# Patient Record
Sex: Male | Born: 2000
Health system: Southern US, Community
[De-identification: ages and names within clinical notes are randomized; demographics above are authoritative.]

## PROBLEM LIST (undated history)

## (undated) DIAGNOSIS — F840 Autistic disorder: Secondary | ICD-10-CM

## (undated) DIAGNOSIS — F909 Attention-deficit hyperactivity disorder, unspecified type: Secondary | ICD-10-CM

## (undated) DIAGNOSIS — J383 Other diseases of vocal cords: Secondary | ICD-10-CM

## (undated) DIAGNOSIS — N200 Calculus of kidney: Secondary | ICD-10-CM

## (undated) DIAGNOSIS — F419 Anxiety disorder, unspecified: Secondary | ICD-10-CM

## (undated) DIAGNOSIS — Q676 Pectus excavatum: Secondary | ICD-10-CM

## (undated) HISTORY — DX: Attention-deficit hyperactivity disorder, unspecified type: F90.9

## (undated) HISTORY — DX: Autistic disorder: F84.0

## (undated) HISTORY — DX: Anxiety disorder, unspecified: F41.9

## (undated) HISTORY — DX: Calculus of kidney: N20.0

## (undated) HISTORY — DX: Pectus excavatum: Q67.6

## (undated) HISTORY — DX: Other diseases of vocal cords: J38.3

---
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
## 2018-02-26 DIAGNOSIS — Z0000 Encounter for general adult medical examination without abnormal findings: Secondary | ICD-10-CM | POA: Diagnosis not present

## 2018-02-26 DIAGNOSIS — Z23 Encounter for immunization: Secondary | ICD-10-CM | POA: Diagnosis not present

## 2020-07-18 DIAGNOSIS — F419 Anxiety disorder, unspecified: Secondary | ICD-10-CM | POA: Diagnosis not present

## 2020-07-18 DIAGNOSIS — Z79899 Other long term (current) drug therapy: Secondary | ICD-10-CM | POA: Diagnosis not present

## 2020-07-18 DIAGNOSIS — F321 Major depressive disorder, single episode, moderate: Secondary | ICD-10-CM | POA: Diagnosis not present

## 2020-07-18 DIAGNOSIS — F902 Attention-deficit hyperactivity disorder, combined type: Secondary | ICD-10-CM | POA: Diagnosis not present

## 2020-11-02 DIAGNOSIS — Z20822 Contact with and (suspected) exposure to covid-19: Secondary | ICD-10-CM | POA: Diagnosis not present

## 2020-11-02 DIAGNOSIS — U071 COVID-19: Secondary | ICD-10-CM | POA: Diagnosis not present

## 2020-11-03 ENCOUNTER — Ambulatory Visit (HOSPITAL_COMMUNITY)
Admission: EM | Admit: 2020-11-03 | Discharge: 2020-11-03 | Disposition: A | Discharge status: Home / Self Care | Payer: BC Managed Care – PPO

## 2020-11-03 ENCOUNTER — Encounter (HOSPITAL_COMMUNITY): Payer: Self-pay

## 2020-11-03 ENCOUNTER — Other Ambulatory Visit: Payer: Self-pay

## 2020-11-03 DIAGNOSIS — H66001 Acute suppurative otitis media without spontaneous rupture of ear drum, right ear: Secondary | ICD-10-CM | POA: Diagnosis not present

## 2020-11-03 DIAGNOSIS — U071 COVID-19: Secondary | ICD-10-CM | POA: Diagnosis not present

## 2020-11-03 DIAGNOSIS — R059 Cough, unspecified: Secondary | ICD-10-CM

## 2020-11-03 MED ORDER — BENZONATATE 100 MG PO CAPS: 100.0000 mg | ORAL_CAPSULE | Freq: Three times a day (TID) | ORAL | 0 refills | Status: DC

## 2020-11-03 MED ORDER — AMOXICILLIN-POT CLAVULANATE 875-125 MG PO TABS: 1.0000 | ORAL_TABLET | Freq: Two times a day (BID) | ORAL | 0 refills | Status: AC

## 2020-11-03 NOTE — Discharge Instructions (Addendum)
You have an ear infection so we have started Augmentin.  Given your young and healthy there is no indication to start oral antivirals for COVID-19.  This antibiotic will not treat your COVID and so you will need to continue using supportive care to manage the symptoms including Tessalon for cough, Mucinex, Flonase, drinking plenty of fluid.  If you have any worsening symptoms including difficulty breathing, high fever, chest pain, worsening cough you need to be seen immediately.  I do recommend that you follow-up with either our clinic or your primary care within a few weeks to ensure clearing of infection.  If you have any concerns please return for reevaluation.

## 2020-11-03 NOTE — ED Triage Notes (Signed)
Pt reports cough x 2 days; right ear pain since this morning. Pt reports + COVID test (home) since this morning.

## 2020-11-03 NOTE — ED Provider Notes (Signed)
[facility]    CSN: [id number] Arrival date & time: 11/03/20  1154      History   Chief Complaint Chief Complaint  Patient presents with   Cough    + COVID   Otalgia    HPI Jordan Cox is a 20 y.o. male.   Patient presents today with a 3 to 4-day history of URI symptoms.  He reports mild nasal congestion, cough, scratchy throat.  He has had some abdominal upset but denies any significant diarrhea, nausea, vomiting, abdominal pain.  He denies associated chest pain, shortness of breath, fever.  His primary concern today is right otalgia.  Reports pain is rated 7 on a 0-10 pain scale, localized to right ear, described as intense aching with periodic shooting pains, no alleviating factors identified.  He has tried ibuprofen and over-the-counter decongestants without improvement of symptoms.  He has been vaccinated for COVID-19 but because of URI symptoms took an at-home COVID test that was positive this morning.  He denies any significant past medical history including asthma, smoking, diabetes, heart disease, chronic liver/kidney disease, immunosuppression that warrant antivirals.  He denies any recent antibiotic use.   Past Medical History:  Diagnosis Date   ADHD (attention deficit hyperactivity disorder)    Pectus excavatum     There are no problems to display for this patient.   Past Surgical History:  Procedure Laterality Date   Nuss Surgery  09/2012   scope of bladder  01/2013       Home Medications    Prior to Admission medications   Medication Sig Start Date End Date Taking? Authorizing Provider  amoxicillin-clavulanate (AUGMENTIN) 875-125 MG tablet Take 1 tablet by mouth every 12 (twelve) hours. 11/03/20  Yes Baran Kuhrt K, PA-C  benzonatate (TESSALON) 100 MG capsule Take 1 capsule (100 mg total) by mouth every 8 (eight) hours. 11/03/20  Yes Janera Peugh K, PA-C  atomoxetine (STRATTERA) 80 MG capsule Take by mouth.    [provider]   ibuprofen (ADVIL,MOTRIN) 800 MG tablet Take 1 tablet (800 mg total) by mouth 3 (three) times daily. 03/22/17   Petrucelli, Samantha R, PA-C  sertraline (ZOLOFT) 25 MG tablet 25 mg.    [provider]    Family History Family History  Problem Relation Age of Onset   ADD / ADHD Sister     Social History Social History   Tobacco Use   Smoking status: Never   Smokeless tobacco: Never  Vaping Use   Vaping Use: Never used  Substance Use Topics   Alcohol use: Never   Drug use: Never     Allergies   Patient has no known allergies.   Review of Systems Review of Systems  Constitutional:  Positive for activity change, appetite change and fatigue. Negative for fever.  HENT:  Positive for congestion, ear pain and sore throat. Negative for sinus pressure and sneezing.   Respiratory:  Positive for cough. Negative for shortness of breath.   Cardiovascular:  Negative for chest pain.  Gastrointestinal:  Negative for abdominal pain, diarrhea, nausea and vomiting.  Musculoskeletal:  Negative for arthralgias and myalgias.  Neurological:  Negative for dizziness, light-headedness and headaches.    Physical Exam Triage Vital Signs ED Triage Vitals  Enc Vitals Group     BP 11/03/20 1254 137/77     Pulse Rate 11/03/20 1254 (!) 107     Resp 11/03/20 1254 18     Temp 11/03/20 1254 97.9 F (36.6 C)  Temp Source 11/03/20 1254 Oral     SpO2 11/03/20 1254 98 %     Weight --      Height --      Head Circumference --      Peak Flow --      Pain Score 11/03/20 1251 7     Pain Loc --      Pain Edu? --      Excl. in GC? --    No data found.  Updated Vital Signs BP 137/77 (BP Location: Left Arm)   Pulse (!) 107   Temp 97.9 F (36.6 C) (Oral)   Resp 18   SpO2 98%   Visual Acuity Right Eye Distance:   Left Eye Distance:   Bilateral Distance:    Right Eye Near:   Left Eye Near:    Bilateral Near:     Physical Exam Vitals reviewed.  Constitutional:      General: He  is awake.     Appearance: Normal appearance. He is normal weight. He is not ill-appearing.     Comments: Very pleasant male appears stated age in no acute distress sitting comfortably in exam room  HENT:     Head: Normocephalic and atraumatic.     Right Ear: Ear canal and external ear normal. Tympanic membrane is erythematous and bulging.     Left Ear: Tympanic membrane, ear canal and external ear normal. Tympanic membrane is not erythematous or bulging.     Nose: Nose normal.     Mouth/Throat:     Pharynx: Uvula midline. Posterior oropharyngeal erythema present. No oropharyngeal exudate.  Cardiovascular:     Rate and Rhythm: Normal rate and regular rhythm.     Heart sounds: Normal heart sounds, S1 normal and S2 normal. No murmur heard. Pulmonary:     Effort: Pulmonary effort is normal. No accessory muscle usage or respiratory distress.     Breath sounds: Normal breath sounds. No stridor. No wheezing, rhonchi or rales.     Comments: Clear to auscultation bilaterally Abdominal:     General: Bowel sounds are normal.     Palpations: Abdomen is soft.     Tenderness: There is no abdominal tenderness.  Lymphadenopathy:     Head:     Right side of head: No submental, submandibular or tonsillar adenopathy.     Left side of head: No submental, submandibular or tonsillar adenopathy.     Cervical: No cervical adenopathy.  Neurological:     Mental Status: He is alert.  Psychiatric:        Behavior: Behavior is cooperative.     UC Treatments / Results  Labs (all labs ordered are listed, but only abnormal results are displayed) Labs Reviewed - No data to display  EKG   Radiology No results found.  Procedures Procedures (including critical care time)  Medications Ordered in UC Medications - No data to display  Initial Impression / Assessment and Plan / UC Course  I have reviewed the triage vital signs and the nursing notes.  Pertinent labs & imaging results that were available  during my care of the patient were reviewed by me and considered in my medical decision making (see chart for details).      No negation for repeat COVID-19 testing given patient had positive at-home COVID test this morning this would not change management.  He is young and otherwise healthy so therefore would not be likely candidate for oral antivirals to treat COVID-19.  Otitis media was  identified on exam and he was started on Augmentin twice daily for 7 days.  Encouraged him to use over-the-counter medications to manage symptoms including Tylenol, ibuprofen, Mucinex, Flonase.  Recommended he rest and drink plenty of fluid.  He was given return to work note with current CDC return to work guidelines based on at home positive test.  Discussed alarm symptoms that warrant emergent evaluation.  Strict return precautions given to which patient expressed understanding.  Final Clinical Impressions(s) / UC Diagnoses   Final diagnoses:  COVID-19  Cough  Non-recurrent acute suppurative otitis media of right ear without spontaneous rupture of tympanic membrane     Discharge Instructions      You have an ear infection so we have started Augmentin.  Given your young and healthy there is no indication to start oral antivirals for COVID-19.  This antibiotic will not treat your COVID and so you will need to continue using supportive care to manage the symptoms including Tessalon for cough, Mucinex, Flonase, drinking plenty of fluid.  If you have any worsening symptoms including difficulty breathing, high fever, chest pain, worsening cough you need to be seen immediately.  I do recommend that you follow-up with either our clinic or your primary care within a few weeks to ensure clearing of infection.  If you have any concerns please return for reevaluation.     ED Prescriptions     Medication Sig Dispense Auth. Provider   amoxicillin-clavulanate (AUGMENTIN) 875-125 MG tablet Take 1 tablet by mouth every  12 (twelve) hours. 14 tablet Aviella Disbrow K, PA-C   benzonatate (TESSALON) 100 MG capsule Take 1 capsule (100 mg total) by mouth every 8 (eight) hours. 21 capsule Bruce Churilla K, PA-C      PDMP not reviewed this encounter.   Jeani Hawking, PA-C 11/03/20 1339

## 2021-01-07 ENCOUNTER — Emergency Department (HOSPITAL_BASED_OUTPATIENT_CLINIC_OR_DEPARTMENT_OTHER): Payer: BC Managed Care – PPO

## 2021-01-07 ENCOUNTER — Other Ambulatory Visit: Payer: Self-pay

## 2021-01-07 ENCOUNTER — Emergency Department (HOSPITAL_BASED_OUTPATIENT_CLINIC_OR_DEPARTMENT_OTHER)
Admission: EM | Admit: 2021-01-07 | Discharge: 2021-01-07 | Disposition: A | Discharge status: Home / Self Care | Payer: BC Managed Care – PPO | Attending: Student | Admitting: Student

## 2021-01-07 ENCOUNTER — Encounter (HOSPITAL_BASED_OUTPATIENT_CLINIC_OR_DEPARTMENT_OTHER): Payer: Self-pay | Admitting: Emergency Medicine

## 2021-01-07 DIAGNOSIS — R109 Unspecified abdominal pain: Secondary | ICD-10-CM | POA: Diagnosis not present

## 2021-01-07 DIAGNOSIS — R1084 Generalized abdominal pain: Secondary | ICD-10-CM | POA: Insufficient documentation

## 2021-01-07 DIAGNOSIS — R519 Headache, unspecified: Secondary | ICD-10-CM | POA: Diagnosis not present

## 2021-01-07 DIAGNOSIS — Z041 Encounter for examination and observation following transport accident: Secondary | ICD-10-CM | POA: Diagnosis not present

## 2021-01-07 DIAGNOSIS — R221 Localized swelling, mass and lump, neck: Secondary | ICD-10-CM | POA: Diagnosis not present

## 2021-01-07 DIAGNOSIS — Y92410 Unspecified street and highway as the place of occurrence of the external cause: Secondary | ICD-10-CM | POA: Diagnosis not present

## 2021-01-07 DIAGNOSIS — R1030 Lower abdominal pain, unspecified: Secondary | ICD-10-CM | POA: Diagnosis not present

## 2021-01-07 LAB — BASIC METABOLIC PANEL
Anion gap: 8 (ref 5–15)
BUN: 12 mg/dL (ref 6–20)
CO2: 28 mmol/L (ref 22–32)
Calcium: 9.4 mg/dL (ref 8.9–10.3)
Chloride: 101 mmol/L (ref 98–111)
Creatinine, Ser: 0.93 mg/dL (ref 0.61–1.24)
GFR, Estimated: >60 mL/min (ref >60)
Glucose, Bld: 80 mg/dL (ref 70–99)
Potassium: 4.1 mmol/L (ref 3.5–5.1)
Sodium: 137 mmol/L (ref 135–145)

## 2021-01-07 LAB — CBC WITH DIFFERENTIAL/PLATELET
Abs Immature Granulocytes: 0.08 10*3/uL — ABNORMAL HIGH (ref 0.00–0.07)
Basophils Absolute: 0 10*3/uL (ref 0.0–0.1)
Basophils Relative: 1 %
Eosinophils Absolute: 0.1 10*3/uL (ref 0.0–0.5)
Eosinophils Relative: 1 %
HCT: 45.9 % (ref 39.0–52.0)
Hemoglobin: 16.3 g/dL (ref 13.0–17.0)
Immature Granulocytes: 1 %
Lymphocytes Relative: 34 %
Lymphs Abs: 2.3 10*3/uL (ref 0.7–4.0)
MCH: 30.8 pg (ref 26.0–34.0)
MCHC: 35.5 g/dL (ref 30.0–36.0)
MCV: 86.8 fL (ref 80.0–100.0)
Monocytes Absolute: 0.7 10*3/uL (ref 0.1–1.0)
Monocytes Relative: 10 %
Neutro Abs: 3.5 10*3/uL (ref 1.7–7.7)
Neutrophils Relative %: 53 %
Platelets: 312 10*3/uL (ref 150–400)
RBC: 5.29 MIL/uL (ref 4.22–5.81)
RDW: 11.6 % (ref 11.5–15.5)
WBC: 6.6 10*3/uL (ref 4.0–10.5)
nRBC: 0 % (ref 0.0–0.2)

## 2021-01-07 MED ORDER — IOHEXOL 300 MG/ML  SOLN
100.0000 mL | Freq: Once | INTRAMUSCULAR | Status: AC | PRN
  Administered 2021-01-07: 100 mL via INTRAVENOUS

## 2021-01-07 NOTE — Discharge Instructions (Signed)
Your CT scans were all very reassuring without acute findings  Please take Ibuprofen and Tylenol as needed for pain  Follow up with your PCP regarding ED visit today  Return to the ED for any new/worsening symptoms
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
## 2021-01-18 DIAGNOSIS — F419 Anxiety disorder, unspecified: Secondary | ICD-10-CM | POA: Diagnosis not present

## 2021-01-18 DIAGNOSIS — Z79899 Other long term (current) drug therapy: Secondary | ICD-10-CM | POA: Diagnosis not present

## 2021-01-18 DIAGNOSIS — F902 Attention-deficit hyperactivity disorder, combined type: Secondary | ICD-10-CM | POA: Diagnosis not present

## 2021-01-18 DIAGNOSIS — F321 Major depressive disorder, single episode, moderate: Secondary | ICD-10-CM | POA: Diagnosis not present

## 2021-02-06 DIAGNOSIS — F902 Attention-deficit hyperactivity disorder, combined type: Secondary | ICD-10-CM | POA: Diagnosis not present

## 2021-02-06 DIAGNOSIS — F840 Autistic disorder: Secondary | ICD-10-CM | POA: Diagnosis not present

## 2021-02-07 DIAGNOSIS — F840 Autistic disorder: Secondary | ICD-10-CM | POA: Diagnosis not present

## 2021-02-07 DIAGNOSIS — F902 Attention-deficit hyperactivity disorder, combined type: Secondary | ICD-10-CM | POA: Diagnosis not present

## 2021-03-25 DIAGNOSIS — F840 Autistic disorder: Secondary | ICD-10-CM | POA: Diagnosis not present

## 2021-03-25 DIAGNOSIS — F331 Major depressive disorder, recurrent, moderate: Secondary | ICD-10-CM | POA: Diagnosis not present

## 2021-03-25 DIAGNOSIS — F902 Attention-deficit hyperactivity disorder, combined type: Secondary | ICD-10-CM | POA: Diagnosis not present

## 2021-07-10 DIAGNOSIS — F840 Autistic disorder: Secondary | ICD-10-CM | POA: Diagnosis not present

## 2021-07-10 DIAGNOSIS — F902 Attention-deficit hyperactivity disorder, combined type: Secondary | ICD-10-CM | POA: Diagnosis not present

## 2021-07-10 DIAGNOSIS — F32A Depression, unspecified: Secondary | ICD-10-CM | POA: Diagnosis not present

## 2021-07-10 DIAGNOSIS — Z79899 Other long term (current) drug therapy: Secondary | ICD-10-CM | POA: Diagnosis not present

## 2021-08-01 DIAGNOSIS — Q676 Pectus excavatum: Secondary | ICD-10-CM | POA: Diagnosis not present

## 2021-08-01 DIAGNOSIS — F840 Autistic disorder: Secondary | ICD-10-CM | POA: Diagnosis not present

## 2021-08-01 DIAGNOSIS — R079 Chest pain, unspecified: Secondary | ICD-10-CM | POA: Diagnosis not present

## 2021-08-08 ENCOUNTER — Ambulatory Visit
Admission: RE | Admit: 2021-08-08 | Discharge: 2021-08-08 | Disposition: A | Discharge status: Home / Self Care | Payer: BC Managed Care – PPO | Source: Ambulatory Visit | Attending: Family Medicine | Admitting: Family Medicine

## 2021-08-08 ENCOUNTER — Other Ambulatory Visit: Payer: Self-pay | Admitting: Family Medicine

## 2021-08-08 DIAGNOSIS — R0602 Shortness of breath: Secondary | ICD-10-CM | POA: Diagnosis not present

## 2021-08-08 DIAGNOSIS — Q676 Pectus excavatum: Secondary | ICD-10-CM

## 2021-08-08 DIAGNOSIS — R079 Chest pain, unspecified: Secondary | ICD-10-CM | POA: Diagnosis not present

## 2021-09-07 ENCOUNTER — Emergency Department (HOSPITAL_BASED_OUTPATIENT_CLINIC_OR_DEPARTMENT_OTHER): Payer: BC Managed Care – PPO

## 2021-09-07 ENCOUNTER — Encounter (HOSPITAL_BASED_OUTPATIENT_CLINIC_OR_DEPARTMENT_OTHER): Payer: Self-pay | Admitting: Emergency Medicine

## 2021-09-07 ENCOUNTER — Emergency Department (HOSPITAL_BASED_OUTPATIENT_CLINIC_OR_DEPARTMENT_OTHER)
Admission: EM | Admit: 2021-09-07 | Discharge: 2021-09-07 | Disposition: A | Discharge status: Home / Self Care | Payer: BC Managed Care – PPO | Attending: Emergency Medicine | Admitting: Emergency Medicine

## 2021-09-07 ENCOUNTER — Other Ambulatory Visit: Payer: Self-pay

## 2021-09-07 DIAGNOSIS — N62 Hypertrophy of breast: Secondary | ICD-10-CM | POA: Diagnosis not present

## 2021-09-07 DIAGNOSIS — R072 Precordial pain: Secondary | ICD-10-CM | POA: Diagnosis not present

## 2021-09-07 DIAGNOSIS — F840 Autistic disorder: Secondary | ICD-10-CM | POA: Insufficient documentation

## 2021-09-07 DIAGNOSIS — R079 Chest pain, unspecified: Secondary | ICD-10-CM

## 2021-09-07 DIAGNOSIS — R918 Other nonspecific abnormal finding of lung field: Secondary | ICD-10-CM | POA: Diagnosis not present

## 2021-09-07 DIAGNOSIS — R0602 Shortness of breath: Secondary | ICD-10-CM | POA: Diagnosis not present

## 2021-09-07 DIAGNOSIS — R0789 Other chest pain: Secondary | ICD-10-CM | POA: Diagnosis not present

## 2021-09-07 LAB — TROPONIN I (HIGH SENSITIVITY)
Troponin I (High Sensitivity): <2 ng/L (ref <18)
Troponin I (High Sensitivity): <2 ng/L (ref <18)

## 2021-09-07 LAB — BASIC METABOLIC PANEL
Anion gap: 6 (ref 5–15)
BUN: 9 mg/dL (ref 6–20)
CO2: 30 mmol/L (ref 22–32)
Calcium: 9.2 mg/dL (ref 8.9–10.3)
Chloride: 104 mmol/L (ref 98–111)
Creatinine, Ser: 0.89 mg/dL (ref 0.61–1.24)
GFR, Estimated: >60 mL/min (ref >60)
Glucose, Bld: 98 mg/dL (ref 70–99)
Potassium: 4.2 mmol/L (ref 3.5–5.1)
Sodium: 140 mmol/L (ref 135–145)

## 2021-09-07 LAB — CBC
HCT: 45.6 % (ref 39.0–52.0)
Hemoglobin: 15.8 g/dL (ref 13.0–17.0)
MCH: 30.9 pg (ref 26.0–34.0)
MCHC: 34.6 g/dL (ref 30.0–36.0)
MCV: 89.2 fL (ref 80.0–100.0)
Platelets: 295 10*3/uL (ref 150–400)
RBC: 5.11 MIL/uL (ref 4.22–5.81)
RDW: 12.1 % (ref 11.5–15.5)
WBC: 6.8 10*3/uL (ref 4.0–10.5)
nRBC: 0 % (ref 0.0–0.2)

## 2021-09-07 MED ORDER — CEPHALEXIN 250 MG PO CAPS
500.0000 mg | ORAL_CAPSULE | Freq: Once | ORAL | Status: DC
  Filled 2021-09-07: qty 2

## 2021-09-07 MED ORDER — NAPROXEN 250 MG PO TABS
500.0000 mg | ORAL_TABLET | Freq: Once | ORAL | Status: AC
  Administered 2021-09-07: 500 mg via ORAL
  Filled 2021-09-07: qty 2

## 2021-09-07 MED ORDER — DIPHENHYDRAMINE HCL 25 MG PO CAPS
25.0000 mg | ORAL_CAPSULE | Freq: Once | ORAL | Status: DC
Start: 2021-09-07 — End: 2021-09-07
  Filled 2021-09-07: qty 1

## 2021-09-07 MED ORDER — IOHEXOL 350 MG/ML SOLN
100.0000 mL | Freq: Once | INTRAVENOUS | Status: AC | PRN
  Administered 2021-09-07: 100 mL via INTRAVENOUS

## 2021-09-07 MED ORDER — IBUPROFEN 400 MG PO TABS
600.0000 mg | ORAL_TABLET | Freq: Once | ORAL | Status: DC
  Filled 2021-09-07: qty 1

## 2021-09-07 MED ORDER — NAPROXEN 500 MG PO TABS: 500.0000 mg | ORAL_TABLET | Freq: Two times a day (BID) | ORAL | 0 refills | Status: DC

## 2021-09-07 NOTE — ED Provider Notes (Signed)
[facility] EMERGENCY DEPARTMENT Provider Note   CSN: [id number] Arrival date & time: 09/07/21  1544     History  Chief Complaint  Patient presents with   Shortness of Breath    MARCIS PETTEYS is a 21 y.o. male.  WESTLEY SIMONELLI is a 21 y.o. male with a history of autism and ADHD, and pectus excavatum surgery, who presents to the ED for evaluation of chest pain.  Patient reports midsternal chest pain and shortness of breath.  Patient reports that he has been having intermittent episodes of chest pain for several months and has been following with his primary care doctor for evaluation of this today while he was helping his mom outside he developed shortness of breath when coming out into the yard and started to feel quite lightheaded and had to go inside and sit down.  Mother reports is the first time he seemed to the short of breath which concerned her.  With rest symptoms seem to improve but she was concerned that they were brought on with exertion.  Aside from prior surgery for pectus excavated him patient has no known cardiac history and no signifi for these issues but has not been seen by cardiologist.  Cant family history in young family members.  No known lung issues.  He reports that he has been having similar substernal chest pains very frequently often when he has to go to work.  He does report some intermittent anxiety.  No abdominal pain nausea, vomiting.  Pain was nonradiating.  No lower extremity swelling or pain.  Patient has been following with his primary care doctor, has not seen cardiology.  The history is provided by the patient and a parent.  Shortness of Breath Associated symptoms: chest pain   Associated symptoms: no cough and no fever        Home Medications Prior to Admission medications   Medication Sig Start Date End Date Taking? Authorizing Provider  naproxen (NAPROSYN) 500 MG tablet Take 1 tablet (500 mg total) by mouth 2 (two) times daily. 09/07/21  Yes  Jacqlyn Larsen, PA-C  amoxicillin-clavulanate (AUGMENTIN) 875-125 MG tablet Take 1 tablet by mouth every 12 (twelve) hours. 11/03/20   Raspet, Derry Skill, PA-C  atomoxetine (STRATTERA) 80 MG capsule Take by mouth.    [provider]  benzonatate (TESSALON) 100 MG capsule Take 1 capsule (100 mg total) by mouth every 8 (eight) hours. 11/03/20   Raspet, Derry Skill, PA-C  ibuprofen (ADVIL,MOTRIN) 800 MG tablet Take 1 tablet (800 mg total) by mouth 3 (three) times daily. 03/22/17   Petrucelli, Samantha R, PA-C  sertraline (ZOLOFT) 25 MG tablet 25 mg.    [provider]      Allergies    Patient has no known allergies.    Review of Systems   Review of Systems  Constitutional:  Negative for chills and fever.  Respiratory:  Positive for shortness of breath. Negative for cough.   Cardiovascular:  Positive for chest pain.  Neurological:  Positive for light-headedness. Negative for syncope.  Psychiatric/Behavioral:  The patient is nervous/anxious.     Physical Exam Updated Vital Signs BP 120/72   Pulse 83   Temp 98.4 F (36.9 C) (Oral)   Resp 14   Ht 6\' 1"  (1.854 m)   Wt 102.1 kg   SpO2 100%   BMI 29.69 kg/m  Physical Exam Vitals and nursing note reviewed.  Constitutional:      General: He is not in acute  distress.    Appearance: Normal appearance. He is well-developed. He is not diaphoretic.     Comments: Patient is alert, well-appearing, does appear anxious but in no acute distress.  HENT:     Head: Normocephalic and atraumatic.  Eyes:     General:        Right eye: No discharge.        Left eye: No discharge.     Pupils: Pupils are equal, round, and reactive to light.  Cardiovascular:     Rate and Rhythm: Normal rate and regular rhythm.     Pulses: Normal pulses.     Heart sounds: Normal heart sounds.  Pulmonary:     Effort: Pulmonary effort is normal. No respiratory distress.     Breath sounds: Normal breath sounds. No wheezing or rales.     Comments: Respirations  equal and unlabored, patient able to speak in full sentences, lungs clear to auscultation bilaterally  Chest:     Chest wall: Tenderness present.     Comments: There is some mild tenderness along the costochondral margin Abdominal:     General: Bowel sounds are normal. There is no distension.     Palpations: Abdomen is soft. There is no mass.     Tenderness: There is no abdominal tenderness. There is no guarding.     Comments: Abdomen soft, nondistended, nontender to palpation in all quadrants without guarding or peritoneal signs  Musculoskeletal:        General: No deformity.     Cervical back: Neck supple.     Right lower leg: No tenderness. No edema.     Left lower leg: No tenderness. No edema.  Skin:    General: Skin is warm and dry.     Capillary Refill: Capillary refill takes less than 2 seconds.  Neurological:     Mental Status: He is alert and oriented to person, place, and time.     Coordination: Coordination normal.     Comments: Speech is clear, able to follow commands CN III-XII intact Normal strength in upper and lower extremities bilaterally including dorsiflexion and plantar flexion, strong and equal grip strength Sensation normal to light and sharp touch Moves extremities without ataxia, coordination intact  Psychiatric:        Mood and Affect: Mood normal.        Behavior: Behavior normal.     ED Results / Procedures / Treatments   Labs (all labs ordered are listed, but only abnormal results are displayed) Labs Reviewed  BASIC METABOLIC PANEL  CBC  TROPONIN I (HIGH SENSITIVITY)  TROPONIN I (HIGH SENSITIVITY)    EKG None  Radiology CT Angio Chest PE W and/or Wo Contrast  Result Date: 09/07/2021 CLINICAL DATA:  Chest pain or SOB, pleurisy or effusion suspected EXAM: CT ANGIOGRAPHY CHEST WITH CONTRAST TECHNIQUE: Multidetector CT imaging of the chest was performed using the standard protocol during bolus administration of intravenous contrast. Multiplanar CT  image reconstructions and MIPs were obtained to evaluate the vascular anatomy. RADIATION DOSE REDUCTION: This exam was performed according to the departmental dose-optimization program which includes automated exposure control, adjustment of the mA and/or kV according to patient size and/or use of iterative reconstruction technique. CONTRAST:  OMNIPAQUE IOHEXOL 350 MG/ML SOLN COMPARISON:  Chest XR, concurrent.  CTA chest 12/28/2020. FINDINGS: Suboptimal evaluation, secondary to poor contrast opacification such that subsegmental emboli could missed. Cardiovascular: Satisfactory opacification of the pulmonary arteries to the segmental level. No evidence of segmental or larger pulmonary  embolism. Normal heart size. No pericardial effusion. Mediastinum/Nodes: No enlarged mediastinal, hilar, or axillary lymph nodes. Thyroid gland, trachea, and esophagus demonstrate no significant findings. Lungs/Pleura: Lungs are clear. No pleural effusion or pneumothorax. Upper Abdomen: No acute abnormality. Musculoskeletal: Gynecomastia. No acute chest wall abnormality. No acute or significant osseous findings. Review of the MIP images confirms the above findings. IMPRESSION: 1. No segmental or larger pulmonary embolus 2. No acute intrathoracic abnormality. Electronically Signed   By: Michaelle Birks M.D.   On: 09/07/2021 17:55   DG Chest 2 View  Result Date: 09/07/2021 CLINICAL DATA:  CP, shob EXAM: CHEST - 2 VIEW COMPARISON:  August 08, 2021 FINDINGS: The cardiomediastinal silhouette is normal in contour. No pleural effusion. No pneumothorax. No acute pleuroparenchymal abnormality. Visualized abdomen is unremarkable. No acute osseous abnormality noted. IMPRESSION: No acute cardiopulmonary abnormality. Electronically Signed   By: Valentino Saxon M.D.   On: 09/07/2021 16:13    Procedures Procedures    Medications Ordered in ED Medications  iohexol (OMNIPAQUE) 350 MG/ML injection 100 mL (100 mLs Intravenous Contrast Given  09/07/21 1726)  naproxen (NAPROSYN) tablet 500 mg (500 mg Oral Given 09/07/21 1921)    ED Course/ Medical Decision Making/ A&P                           Medical Decision Making Amount and/or Complexity of Data Reviewed Labs: ordered. Radiology: ordered.  Risk Prescription drug management.   Patient presents to the emergency department with chest pain. Patient nontoxic appearing, in no apparent distress, vitals without significant abnormality. Fairly benign physical exam.   DDX including but not limited to: ACS, pulmonary embolism, dissection, pneumothorax, pneumonia, arrhythmia, severe anemia, MSK, GERD, anxiety, abdominal process.   Additional history obtained:  Chart & nursing note reviewed.  History obtained from mom at bedside  EKG: NSR without ischemic changes  Lab Tests:  I reviewed & interpreted labs including:  No leukocytosis, normal hemoglobin, no electrolyte derangements, normal renal function, troponin negative x2  Imaging Studies ordered:  I ordered and viewed the following imaging, agree with radiologist impression:  Chest x-ray with no active cardiopulmonary disease.  CTA with no PE and no acute intrathoracic abnormality  ED Course:    RE-EVAL: Pain has resolved and patient has no further shortness of breath or increased work of breathing  EKG without obvious acute ischemia, delta troponin negative, doubt ACS.  CTA negative for PE or other acute abnormality.  Pain is not a tearing sensation, symmetric pulses, no widening of mediastinum on CXR, doubt dissection. Cardiac monitor reviewed, no notable arrhythmias or tachycardia   Based on patient's chief complaint, I considered admission might be necessary, however after reassuring ED workup feel patient is reasonable for discharge.   I discussed results, treatment plan, need for PCP follow-up, and return precautions with the patient. Provided opportunity for questions, patient confirmed understanding and is in  agreement with plan.    Portions of this note were generated with [employer]. Dictation errors may occur despite best attempts at proofreading.         Final Clinical Impression(s) / ED Diagnoses Final diagnoses:  Right-sided chest pain  Shortness of breath    Rx / DC Orders ED Discharge Orders          Ordered    naproxen (NAPROSYN) 500 MG tablet  2 times daily        09/07/21 1923  Dartha Lodge, PA-C 09/21/21 1121    Alvira Monday, MD 09/21/21 [id number]

## 2021-09-07 NOTE — ED Triage Notes (Signed)
Pt arrives pov, steady gait, c/o mid sternal CP and shob today. Currently being treated for CP for severl months. Pt states "working in yard and felt shob and light headed.

## 2021-09-07 NOTE — ED Notes (Signed)
Patient transported to CT 

## 2021-09-07 NOTE — ED Notes (Signed)
Pt reports CP is w/ exertion and intermittent

## 2021-09-07 NOTE — Discharge Instructions (Signed)
Evaluation today has been reassuring, CT without signs of blood clot or other findings.  Symptoms could be due to inflammation or musculoskeletal pain.  Take anti-inflammatory twice daily for the next week and see if this helps.  Anxiety could be exacerbating symptoms.  Follow-up with your primary care provider on Monday as planned.  Return for new or worsening symptoms.

## 2021-09-09 DIAGNOSIS — Z13220 Encounter for screening for lipoid disorders: Secondary | ICD-10-CM | POA: Diagnosis not present

## 2021-09-09 DIAGNOSIS — R079 Chest pain, unspecified: Secondary | ICD-10-CM | POA: Diagnosis not present

## 2021-09-14 NOTE — Progress Notes (Unsigned)
Cardiology Office Note:    Date:  09/14/2021   ID:  Jordan Cox, DOB 02/10/2001, MRN [id number]  PCP:  Jordan Palmer, MD   [facility] Providers Cardiologist:  Jordan Skeans, MD Referring MD: Jordan Bussing, MD   Chief Complaint/Reason for Referral: Chest pain  ASSESSMENT:    1. Precordial pain   2. Pectus excavatum   3. Autism     PLAN:    In order of problems listed above: 1.  Chest pain:  We will obtain a coronary CTA and echocardiogram to evaluate further.  If the patient has mild obstructive coronary artery disease, they will require a statin (with goal LDL < 70) and aspirin, if they have high-grade disease we will need to consider optimal medical therapy and if symptoms are refractory to medical therapy, then a cardiac catheterization with possible PCI will be pursued to alleviate symptoms.  If they have high risk disease we will proceed directly to cardiac catheterization.  We will keep follow-up open-ended depending on these results. 2.  Pectus excavatum: If his ischemic evaluation is unremarkable then perhaps his pain is musculoskeletal in nature and related to his pectus excavatum repair. 3.  Autism: This is being managed by other providers.         {Are you ordering a CV Procedure (e.g. stress test, cath, DCCV, TEE, etc)?   Press F2        :[id number]}   Dispo:  No follow-ups on file.      Medication Adjustments/Labs and Tests Ordered: Current medicines are reviewed at length with the patient today.  Concerns regarding medicines are outlined above.  The following changes have been made:  {PLAN; NO CHANGE:13088:s}   Labs/tests ordered: No orders of the defined types were placed in this encounter.   Medication Changes: No orders of the defined types were placed in this encounter.    Current medicines are reviewed at length with the patient today.  The patient {ACTIONS; HAS/DOES NOT HAVE:19233} concerns regarding medicines.   History of Present  Illness:    FOCUSED PROBLEM LIST:   1.  Pectus excavatum status post repair 2.  Autism 3.  BMI of 20  The patient is a 21 y.o. male with the indicated medical history here for chest pain.  He has had intermittent chest pain over the last several weeks.  Apparently he works at [employer] and does lift a lot of heavy things on occasion.  He will have chest pain lasting several weeks.  He was seen in the emergency department earlier this month due to shortness of breath.  His cardiac biomarkers and other laboratories were unremarkable.  He underwent a CT protocol for pulmonary embolism which was also unremarkable.  He was discharged on Naprosyn.          Current Medications: No outpatient medications have been marked as taking for the 09/16/21 encounter (Appointment) with Jordan Pyo, MD.     Allergies:    Patient has no known allergies.   Social History:   Social History   Tobacco Use   Smoking status: Never   Smokeless tobacco: Never  Vaping Use   Vaping Use: Never used  Substance Use Topics   Alcohol use: Never   Drug use: Never     Family Hx: Family History  Problem Relation Age of Onset   ADD / ADHD Sister      Review of Systems:   Please see the history of present illness.    All  other systems reviewed and are negative.     EKGs/Labs/Other Test Reviewed:    EKG:  EKG performed July 2023 that I personally reviewed demonstrates sinus rhythm  Prior CV studies: None available  Other studies Reviewed: Review of the additional studies/records demonstrates: PE protocol CT July 2023 without PE or coronary artery calcification  Recent Labs: 09/07/2021: BUN 9; Creatinine, Ser 0.89; Hemoglobin 15.8; Platelets 295; Potassium 4.2; Sodium 140   Recent Lipid Panel No results found for: "CHOL", "TRIG", "HDL", "LDLCALC", "LDLDIRECT"  Risk Assessment/Calculations:          Physical Exam:    VS:  There were no vitals taken for this visit.   Wt Readings from Last  3 Encounters:  09/07/21 225 lb (102.1 kg)  01/07/21 225 lb (102.1 kg)  03/22/17 200 lb (90.7 kg) (93 %, Z= 1.51)*   * Growth percentiles are based on CDC (Boys, 2-20 Years) data.    GENERAL:  No apparent distress, AOx3 HEENT:  No carotid bruits, +2 carotid impulses, no scleral icterus CAR: RRR Irregular RR*** no murmurs***, gallops, rubs, or thrills RES:  Clear to auscultation bilaterally ABD:  Soft, nontender, nondistended, positive bowel sounds x 4 VASC:  +2 radial pulses, +2 carotid pulses, palpable pedal pulses NEURO:  CN 2-12 grossly intact; motor and sensory grossly intact PSYCH:  No active depression or anxiety EXT:  No edema, ecchymosis, or cyanosis  Signed, Jordan Pyo, MD  09/14/2021 7:51 AM    [facility] [address] [place], [place], [place]  [address] Phone: [phone]; Fax: [phone]   Note:  This document was prepared using Dragon voice recognition software and may include unintentional dictation errors.

## 2021-09-16 ENCOUNTER — Encounter: Payer: Self-pay | Admitting: Internal Medicine

## 2021-09-16 ENCOUNTER — Ambulatory Visit (INDEPENDENT_AMBULATORY_CARE_PROVIDER_SITE_OTHER): Payer: BC Managed Care – PPO | Admitting: Internal Medicine

## 2021-09-16 VITALS — BP 118/72 | HR 98 | Ht 73.0 in | Wt 217.6 lb

## 2021-09-16 DIAGNOSIS — Q676 Pectus excavatum: Secondary | ICD-10-CM | POA: Diagnosis not present

## 2021-09-16 DIAGNOSIS — F840 Autistic disorder: Secondary | ICD-10-CM | POA: Diagnosis not present

## 2021-09-16 DIAGNOSIS — R072 Precordial pain: Secondary | ICD-10-CM | POA: Diagnosis not present

## 2021-09-16 MED ORDER — METOPROLOL TARTRATE 100 MG PO TABS: 100.0000 mg | ORAL_TABLET | Freq: Once | ORAL | 0 refills | Status: DC

## 2021-09-16 NOTE — Patient Instructions (Addendum)
Medication Instructions:  Your physician recommends that you continue on your current medications as directed. Please refer to the Current Medication list given to you today.  *If you need a refill on your cardiac medications before your next appointment, please call your pharmacy*  Lab Work: TODAY: CRP  If you have labs (blood work) drawn today and your tests are completely normal, you will receive your results only by: MyChart Message (if you have MyChart) OR A paper copy in the mail If you have any lab test that is abnormal or we need to change your treatment, we will call you to review the results.  Testing/Procedures: Your physician has recommended you have a coronary CTA performed.  Your physician has requested that you have an echocardiogram. Echocardiography is a painless test that uses sound waves to create images of your heart. It provides your doctor with information about the size and shape of your heart and how well your heart's chambers and valves are working. This procedure takes approximately one hour. There are no restrictions for this procedure.  Follow-Up: At [facility], you and your health needs are our priority.  As part of our continuing mission to provide you with exceptional heart care, we have created designated Provider Care Teams.  These Care Teams include your primary Cardiologist (physician) and Advanced Practice Providers (APPs -  Physician Assistants and Nurse Practitioners) who all work together to provide you with the care you need, when you need it.  Your next appointment:   As needed  The format for your next appointment:   In Person  Provider:   [name], MD {  Other Instructions   Your cardiac CT will be scheduled at the below location:   [facility] [address] [place], [place] [address] [phone]  Please arrive at the [facility] Entrance (Entrance C2) of [facility] 30 minutes prior to  test start time. You can use the FREE valet parking offered at entrance C (encouraged to control the heart rate for the test)  Proceed to the [facility] Radiology Department (first floor) to check-in and test prep.  All radiology patients and guests should use entrance C2 at [facility], accessed from [facility], even though the hospital's physical address listed is [address].    Please follow these instructions carefully (unless otherwise directed):  On the Night Before the Test: Be sure to Drink plenty of water. Do not consume any caffeinated/decaffeinated beverages or chocolate 12 hours prior to your test. Do not take any antihistamines 12 hours prior to your test.  On the Day of the Test: Drink plenty of water until 1 hour prior to the test. Do not eat any food 4 hours prior to the test. You may take your regular medications prior to the test.  Take metoprolol (Lopressor) 100mg  two hours prior to test.      After the Test: Drink plenty of water. After receiving IV contrast, you may experience a mild flushed feeling. This is normal. On occasion, you may experience a mild rash up to 24 hours after the test. This is not dangerous. If this occurs, you can take Benadryl 25 mg and increase your fluid intake. If you experience trouble breathing, this can be serious. If it is severe call 911 IMMEDIATELY. If it is mild, please call our office. If you take any of these medications: Glipizide/Metformin, Avandament, Glucavance, please do not take 48 hours after completing test  unless otherwise instructed.  We will call to schedule your test 2-4 weeks out understanding that some insurance companies will need an authorization prior to the service being performed.   For non-scheduling related questions, please contact the cardiac imaging nurse navigator should you have any questions/concerns: Rockwell Alexandria, Cardiac Imaging Nurse Navigator Larey Brick, Cardiac  Imaging Nurse Navigator [facility] Direct Office Dial: [phone]   For scheduling needs, including cancellations and rescheduling, please call [place], [phone].   Important Information About Sugar

## 2021-09-17 LAB — C-REACTIVE PROTEIN: CRP: <1 mg/L (ref 0–10)

## 2021-09-17 NOTE — Addendum Note (Signed)
Addended by: Dennis Bast F on: 09/17/2021 05:09 PM   Modules accepted: Orders

## 2021-09-18 NOTE — Addendum Note (Signed)
Addended byDurenda Hurt on: 09/18/2021 12:19 PM   Modules accepted: Orders

## 2021-09-18 NOTE — Addendum Note (Signed)
Addended byDurenda Hurt on: 09/18/2021 12:18 PM   Modules accepted: Orders

## 2021-10-03 ENCOUNTER — Ambulatory Visit (HOSPITAL_COMMUNITY): Payer: BC Managed Care – PPO | Attending: Cardiology

## 2021-10-03 DIAGNOSIS — R072 Precordial pain: Secondary | ICD-10-CM | POA: Insufficient documentation

## 2021-10-03 LAB — ECHOCARDIOGRAM COMPLETE
Area-P 1/2: 3.79 cm2
S' Lateral: 3.3 cm

## 2021-10-08 ENCOUNTER — Telehealth (HOSPITAL_COMMUNITY): Payer: Self-pay | Admitting: *Deleted

## 2021-10-08 ENCOUNTER — Telehealth (HOSPITAL_COMMUNITY): Payer: Self-pay | Admitting: Emergency Medicine

## 2021-10-08 NOTE — Telephone Encounter (Signed)
Attempted to call patient regarding upcoming cardiac CT appointment. °Left message on voicemail with name and callback number °Sasha Rueth RN Navigator Cardiac Imaging °[facility] °[phone] Office °[phone] Cell ° °

## 2021-10-08 NOTE — Telephone Encounter (Signed)
Reaching out to patient to offer assistance regarding upcoming cardiac imaging study; pt verbalizes understanding of appt date/time, parking situation and where to check in, pre-test NPO status and medications ordered, and verified current allergies; name and call back number provided for further questions should they arise  Ariela Mochizuki RN Navigator Cardiac Imaging [facility] [phone] office [phone] cell  Patient to take 100mg metoprolol tartrate two hours prior to his cardiac CT scan. He is aware to arrive at 2pm. 

## 2021-10-09 ENCOUNTER — Ambulatory Visit (HOSPITAL_COMMUNITY): Admission: RE | Admit: 2021-10-09 | Payer: BC Managed Care – PPO | Source: Ambulatory Visit

## 2021-10-09 ENCOUNTER — Telehealth (HOSPITAL_COMMUNITY): Payer: Self-pay | Admitting: Emergency Medicine

## 2021-10-09 DIAGNOSIS — R072 Precordial pain: Secondary | ICD-10-CM

## 2021-10-09 MED ORDER — METOPROLOL TARTRATE 100 MG PO TABS: 100.0000 mg | ORAL_TABLET | Freq: Once | ORAL | 0 refills | Status: DC

## 2021-10-09 NOTE — Telephone Encounter (Signed)
Pt calling reporting he misplaced metoprolol tab for todays CCTA appt.   I resent rx to [facility], went ahead and r/s CCTA appt for next available time 10/29/21 at 2:00pm.  Pt aware to hold on to metop tab until this appt. We will reach out again closer to appt to review instructions in detail.   Rockwell Alexandria RN Navigator Cardiac Imaging [facility] [phone] Office  [phone] Cell

## 2021-10-28 ENCOUNTER — Telehealth (HOSPITAL_COMMUNITY): Payer: Self-pay | Admitting: Emergency Medicine

## 2021-10-28 NOTE — Telephone Encounter (Signed)
Reaching out to patient to offer assistance regarding upcoming cardiac imaging study; pt verbalizes understanding of appt date/time, parking situation and where to check in, pre-test NPO status and medications ordered, and verified current allergies; name and call back number provided for further questions should they arise Rockwell Alexandria RN Navigator Cardiac Imaging [facility] [phone] office [phone] cell  Arrival 130 w/c entrance Denies iv isuses Aware nitro 100mg  metoprolol tartrate

## 2021-10-29 ENCOUNTER — Ambulatory Visit (HOSPITAL_COMMUNITY)
Admission: RE | Admit: 2021-10-29 | Discharge: 2021-10-29 | Disposition: A | Discharge status: Home / Self Care | Payer: BC Managed Care – PPO | Source: Ambulatory Visit | Attending: Internal Medicine | Admitting: Internal Medicine

## 2021-10-29 ENCOUNTER — Other Ambulatory Visit (HOSPITAL_COMMUNITY): Payer: Self-pay | Admitting: *Deleted

## 2021-10-29 ENCOUNTER — Other Ambulatory Visit (HOSPITAL_COMMUNITY): Payer: Self-pay | Admitting: Internal Medicine

## 2021-10-29 DIAGNOSIS — R072 Precordial pain: Secondary | ICD-10-CM

## 2021-10-29 MED ORDER — NITROGLYCERIN 0.4 MG SL SUBL
0.8000 mg | SUBLINGUAL_TABLET | Freq: Once | SUBLINGUAL | Status: AC
  Administered 2021-10-29: 0.8 mg via SUBLINGUAL

## 2021-10-29 MED ORDER — NITROGLYCERIN 0.4 MG SL SUBL
SUBLINGUAL_TABLET | SUBLINGUAL | Status: AC
  Filled 2021-10-29: qty 2

## 2021-10-29 MED ORDER — IOHEXOL 350 MG/ML SOLN
100.0000 mL | Freq: Once | INTRAVENOUS | Status: AC | PRN
  Administered 2021-10-29: 100 mL via INTRAVENOUS

## 2021-10-29 MED ORDER — METOPROLOL TARTRATE 100 MG PO TABS: 100.0000 mg | ORAL_TABLET | Freq: Once | ORAL | 0 refills | Status: AC

## 2021-12-03 DIAGNOSIS — F900 Attention-deficit hyperactivity disorder, predominantly inattentive type: Secondary | ICD-10-CM | POA: Diagnosis not present

## 2021-12-03 DIAGNOSIS — F411 Generalized anxiety disorder: Secondary | ICD-10-CM | POA: Diagnosis not present

## 2021-12-17 DIAGNOSIS — F411 Generalized anxiety disorder: Secondary | ICD-10-CM | POA: Diagnosis not present

## 2021-12-17 DIAGNOSIS — F900 Attention-deficit hyperactivity disorder, predominantly inattentive type: Secondary | ICD-10-CM | POA: Diagnosis not present

## 2021-12-26 DIAGNOSIS — H66001 Acute suppurative otitis media without spontaneous rupture of ear drum, right ear: Secondary | ICD-10-CM | POA: Diagnosis not present

## 2021-12-31 DIAGNOSIS — F900 Attention-deficit hyperactivity disorder, predominantly inattentive type: Secondary | ICD-10-CM | POA: Diagnosis not present

## 2021-12-31 DIAGNOSIS — F411 Generalized anxiety disorder: Secondary | ICD-10-CM | POA: Diagnosis not present

## 2022-01-10 DIAGNOSIS — Z79899 Other long term (current) drug therapy: Secondary | ICD-10-CM | POA: Diagnosis not present

## 2022-01-10 DIAGNOSIS — F902 Attention-deficit hyperactivity disorder, combined type: Secondary | ICD-10-CM | POA: Diagnosis not present

## 2022-01-10 DIAGNOSIS — F064 Anxiety disorder due to known physiological condition: Secondary | ICD-10-CM | POA: Diagnosis not present

## 2022-01-10 DIAGNOSIS — F840 Autistic disorder: Secondary | ICD-10-CM | POA: Diagnosis not present
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
## 2022-04-16 DIAGNOSIS — U071 COVID-19: Secondary | ICD-10-CM | POA: Diagnosis not present

## 2022-07-14 DIAGNOSIS — F064 Anxiety disorder due to known physiological condition: Secondary | ICD-10-CM | POA: Diagnosis not present

## 2022-07-14 DIAGNOSIS — F840 Autistic disorder: Secondary | ICD-10-CM | POA: Diagnosis not present

## 2022-07-14 DIAGNOSIS — F902 Attention-deficit hyperactivity disorder, combined type: Secondary | ICD-10-CM | POA: Diagnosis not present

## 2022-07-14 DIAGNOSIS — Z79899 Other long term (current) drug therapy: Secondary | ICD-10-CM | POA: Diagnosis not present

## 2022-12-23 DIAGNOSIS — R21 Rash and other nonspecific skin eruption: Secondary | ICD-10-CM | POA: Diagnosis not present

## 2022-12-30 DIAGNOSIS — F32A Depression, unspecified: Secondary | ICD-10-CM | POA: Diagnosis not present

## 2023-01-14 DIAGNOSIS — Z79899 Other long term (current) drug therapy: Secondary | ICD-10-CM | POA: Diagnosis not present

## 2023-01-14 DIAGNOSIS — F064 Anxiety disorder due to known physiological condition: Secondary | ICD-10-CM | POA: Diagnosis not present

## 2023-01-14 DIAGNOSIS — F840 Autistic disorder: Secondary | ICD-10-CM | POA: Diagnosis not present

## 2023-01-14 DIAGNOSIS — F902 Attention-deficit hyperactivity disorder, combined type: Secondary | ICD-10-CM | POA: Diagnosis not present

## 2023-01-21 DIAGNOSIS — F32A Depression, unspecified: Secondary | ICD-10-CM | POA: Diagnosis not present

## 2023-03-30 DIAGNOSIS — F32A Depression, unspecified: Secondary | ICD-10-CM | POA: Diagnosis not present

## 2023-04-07 DIAGNOSIS — Z23 Encounter for immunization: Secondary | ICD-10-CM | POA: Diagnosis not present

## 2023-04-07 DIAGNOSIS — Z0000 Encounter for general adult medical examination without abnormal findings: Secondary | ICD-10-CM | POA: Diagnosis not present

## 2023-04-22 DIAGNOSIS — E66811 Obesity, class 1: Secondary | ICD-10-CM | POA: Diagnosis not present

## 2023-04-22 DIAGNOSIS — Z6831 Body mass index (BMI) 31.0-31.9, adult: Secondary | ICD-10-CM | POA: Diagnosis not present

## 2023-05-11 DIAGNOSIS — F32A Depression, unspecified: Secondary | ICD-10-CM | POA: Diagnosis not present

## 2023-06-22 DIAGNOSIS — E6609 Other obesity due to excess calories: Secondary | ICD-10-CM | POA: Diagnosis not present

## 2023-06-22 DIAGNOSIS — Z6831 Body mass index (BMI) 31.0-31.9, adult: Secondary | ICD-10-CM | POA: Diagnosis not present

## 2023-07-06 DIAGNOSIS — F32A Depression, unspecified: Secondary | ICD-10-CM | POA: Diagnosis not present
